# Patient Record
Sex: Male | Born: 1970 | Race: White | Hispanic: No | Marital: Married | Smoking: Former smoker
Health system: Southern US, Community
[De-identification: ages and names within clinical notes are randomized; demographics above are authoritative.]

## PROBLEM LIST (undated history)

## (undated) DIAGNOSIS — I10 Essential (primary) hypertension: Secondary | ICD-10-CM

## (undated) HISTORY — DX: Essential (primary) hypertension: I10

---

## 2015-05-13 ENCOUNTER — Encounter: Payer: Self-pay | Admitting: Allergy and Immunology

## 2015-05-13 ENCOUNTER — Ambulatory Visit (INDEPENDENT_AMBULATORY_CARE_PROVIDER_SITE_OTHER): Payer: BC Managed Care – PPO | Admitting: Allergy and Immunology

## 2015-05-13 VITALS — BP 120/62 | HR 76 | Temp 98.6°F | Resp 22 | Ht 65.67 in | Wt 252.4 lb

## 2015-05-13 DIAGNOSIS — H1010 Acute atopic conjunctivitis, unspecified eye: Secondary | ICD-10-CM

## 2015-05-13 DIAGNOSIS — J309 Allergic rhinitis, unspecified: Secondary | ICD-10-CM

## 2015-05-13 DIAGNOSIS — R05 Cough: Secondary | ICD-10-CM

## 2015-05-13 DIAGNOSIS — J329 Chronic sinusitis, unspecified: Secondary | ICD-10-CM | POA: Diagnosis not present

## 2015-05-13 DIAGNOSIS — K219 Gastro-esophageal reflux disease without esophagitis: Secondary | ICD-10-CM | POA: Diagnosis not present

## 2015-05-13 DIAGNOSIS — T464X5A Adverse effect of angiotensin-converting-enzyme inhibitors, initial encounter: Secondary | ICD-10-CM

## 2015-05-13 DIAGNOSIS — J4531 Mild persistent asthma with (acute) exacerbation: Secondary | ICD-10-CM

## 2015-05-13 MED ORDER — METHYLPREDNISOLONE ACETATE 80 MG/ML IJ SUSP
80.0000 mg | Freq: Once | INTRAMUSCULAR | Status: AC
  Administered 2015-05-13: 80 mg via INTRAMUSCULAR

## 2015-05-13 MED ORDER — AMOXICILLIN-POT CLAVULANATE 875-125 MG PO TABS: 1.0000 | ORAL_TABLET | Freq: Two times a day (BID) | ORAL | Status: DC

## 2015-05-13 MED ORDER — MONTELUKAST SODIUM 10 MG PO TABS: 10.0000 mg | ORAL_TABLET | Freq: Every day | ORAL | Status: DC

## 2015-05-13 MED ORDER — LOSARTAN POTASSIUM 50 MG PO TABS: 50.0000 mg | ORAL_TABLET | Freq: Every day | ORAL | Status: AC

## 2015-05-13 NOTE — Patient Instructions (Addendum)
  1. Allergen avoidance measures  2. Treat inflammation:   A. OTC Rhinocort one spray each nostril one time per day. Aim away from septum  B. montelukast 10 mg one tablet one time per day  C. Depo-Medrol 80 IM delivered in clinic today  3. Change lisinopril to losartan 50 mg one tablet one time per day. Check BP  4. Treat reflux:   A. minimize caffeine and chocolate consumption  B. OTC ranitidine 300 mg daily (150 mg tablets)  5. Treat infection:   A. Augmentin 875 one tablet twice a day for the next 20 days  6. If needed:   A. nasal saline multiple times a day  B. ProAir HFA or Proventil HFA 2 puffs every 4-6 hours  7. Return to clinic in 3 weeks or earlier if problem  8. Address the issue with lower extremity and upper extremity nerve problem

## 2015-05-13 NOTE — Addendum Note (Signed)
Addended by: Redge Gainer on: 05/13/2015 03:58 PM   Modules accepted: Orders

## 2015-05-13 NOTE — Progress Notes (Signed)
[facility]    NEW PATIENT NOTE  Referring Provider: No ref. provider found Primary Provider: Karie Schwalbe, PA Date of office visit: 05/13/2015    Subjective:   Chief Complaint:  Calvin Hopkins is a 45 y.o. male with a chief complaint of Pruritis and Nasal Congestion  who presents to the clinic with the following problems:  HPI Comments: Calvin Hopkins presents this clinic on 05/13/2015 in evaluation of respiratory tract problems and itching.  Calvin Hopkins is been itching for the past year. He massively itching for a little bit longer than a year. His itching is plateaued and has not progressed. There is no associated systemic or constitutional symptoms and he notes no obvious provoking factor giving rise to this issue. He did start lisinopril about a year ago but he thinks he started lisinopril after the itching.  Calvin Hopkins has developed problems over the course the past month or 2 with persistent nasal congestion and head fullness and decreased ability to smell along with some slight sneezing. He blows out clear nasal discharge. He's been treated with 2 antibiotics which have not really helped him very much. He was using an over-the-counter nasal decongestant spray which she stopped about a week ago. He does have pounding pressure in his head but no frank headache area he does have raw throat and itchy throat and throat clearing and constant cough. Sometimes he feels a little bit short of breath and his lungs. He went to the urgent care center was given a Proventil inhaler which has not really helped him. It should be noted that some of his cough may have started after he initiated treatment with lisinopril. He does not have any ugly sputum production or chest pain. He does not have a tremendous amount of reflux symptoms but he does belch all the time.   Past Medical History  Diagnosis Date  . Hypertension     History reviewed. No pertinent past  surgical history.   Current outpatient prescriptions:  .  ANDROGEL PUMP 20.25 MG/ACT (1.62%) GEL, , Disp: , Rfl:  .  azithromycin (ZITHROMAX) 500 MG tablet, , Disp: , Rfl:  .  busPIRone (BUSPAR) 15 MG tablet, , Disp: , Rfl:  .  CHERATUSSIN AC 100-10 MG/5ML syrup, , Disp: , Rfl:  .  lisinopril (PRINIVIL,ZESTRIL) 20 MG tablet, Take 20 mg by mouth., Disp: , Rfl:  .  Omega-3 1000 MG CAPS, Take by mouth., Disp: , Rfl:   No orders of the defined types were placed in this encounter.    No Known Allergies  Review of systems negative except as noted in HPI / PMHx or noted below:  Review of Systems  Constitutional: Negative.   HENT: Negative.   Eyes: Negative.   Respiratory: Negative.        Sleep apnea treated with CPAP  Cardiovascular: Negative.   Gastrointestinal: Negative.   Genitourinary: Negative.   Musculoskeletal: Negative.   Skin: Negative.   Neurological: Negative.        Calvin Hopkins has had left lower extremity pain including his left buttocks, left knee, and left heel. He is also developed left arm tingling and numbness. This is been a slow progressive problem for a while maybe over a year. He is seen a [facility] of the past month.  Endo/Heme/Allergies: Negative.   Psychiatric/Behavioral: Negative.        Calvin Hopkins's felt like he's had some anxiety and some decreased ability to sleep over the course of the  past year.    Family History  Problem Relation Age of Onset  . Allergic rhinitis Brother   . Asthma Brother     Social History   Social History  . Marital Status: Married    Spouse Name: N/A  . Number of Children: N/A  . Years of Education: N/A   Occupational History  . Not on file.   Social History Main Topics  . Smoking status: Former Smoker    Quit date: 05/12/2005  . Smokeless tobacco: Never Used  . Alcohol Use: Not on file  . Drug Use: Not on file  . Sexual Activity: Not on file   Other Topics Concern  . Not on file   Social History Narrative  . No  narrative on file    Environmental and Social history  Lives in a house with a dry environment, no animals located inside the household, carpeting in the bedroom, sleeping of step mattress without any plastic for the bed or pillow, and no smokers located inside the household. He works as a Production designer, theatre/television/film man at [employer] and is exposed to all forms of maintenance fumes and dust.   Objective:   Filed Vitals:   05/13/15 0931  BP: 120/62  Pulse: 76  Temp: 98.6 F (37 C)  Resp: 22   Height: 5' 5.67" (166.8 cm) Weight: 252 lb 6.8 oz (114.5 kg)  Physical Exam  Constitutional: He is well-developed, well-nourished, and in no distress. No distress.  Nasal voice  HENT:  Head: Normocephalic. Head is without right periorbital erythema and without left periorbital erythema.  Right Ear: Tympanic membrane, external ear and ear canal normal.  Left Ear: Tympanic membrane, external ear and ear canal normal.  Nose: Mucosal edema (Left septal excoriation) present. No rhinorrhea.  Mouth/Throat: Oropharynx is clear and moist and mucous membranes are normal. No oropharyngeal exudate.  Eyes: Conjunctivae and lids are normal. Pupils are equal, round, and reactive to light.  Neck: Trachea normal. No tracheal deviation present. No thyromegaly present.  Cardiovascular: Normal rate, regular rhythm, S1 normal, S2 normal and normal heart sounds.   No murmur heard. Pulmonary/Chest: Effort normal. No stridor. No tachypnea. No respiratory distress. He has no wheezes. He has no rales. He exhibits no tenderness.  Abdominal: Soft. He exhibits no distension and no mass. There is no hepatosplenomegaly. There is no tenderness. There is no rebound and no guarding.  Musculoskeletal: He exhibits no edema or tenderness.  Lymphadenopathy:       Head (right side): No tonsillar adenopathy present.       Head (left side): No tonsillar adenopathy present.    He has no cervical adenopathy.    He has no axillary adenopathy.   Neurological: He is alert. Gait normal.  Skin: No rash noted. He is not diaphoretic. No erythema. No pallor. Nails show no clubbing.  Psychiatric: Mood and affect normal.     Diagnostics:  Allergy skin tests were performed. He demonstrated hypersensitivity to grasses, weeds, and house dust mite  Spirometry was performed and demonstrated an FEV1 of 2.63 @ 74 % of predicted.    The patient had an Asthma Control Test with the following results:  .     Assessment and Plan:    1. Allergic rhinoconjunctivitis   2. Chronic sinusitis, unspecified location   3. Asthma, not well controlled, mild persistent, with acute exacerbation   4. Gastroesophageal reflux disease, esophagitis presence not specified   5. ACE-inhibitor cough     1. Allergen avoidance measures  2. Treat inflammation:   A. OTC Rhinocort one spray each nostril one time per day. Aim away from septum  B. montelukast 10 mg one tablet one time per day  C. Depo-Medrol 80 IM delivered in clinic today  3. Change lisinopril to losartan 50 mg one tablet one time per day. Check BP  4. Treat reflux:   A. minimize caffeine and chocolate consumption  B. OTC ranitidine 300 mg daily (150 mg tablets)  5. Treat infection:   A. Augmentin 875 one tablet twice a day for the next 20 days  6. If needed:   A. nasal saline multiple times a day  B. ProAir HFA or Proventil HFA 2 puffs every 4-6 hours  7. Return to clinic in 3 weeks or earlier if problem  8. Address the issue with lower extremity and upper extremity nerve problem  Calvin Hopkins has significant inflammation of his respiratory tract and I will assume that the majority of the symptoms are secondary to this inflammatory process complicated by a prolonged episode of sinusitis. Some of his respiratory tract symptoms may be tied up with reflux and may be tied up with the use of his lisinopril and we'll have him eliminate lisinopril at the same time that he does address the issue  of reflux by using ranitidine. I don't think his asthma is particularly active and for the most part should not be a particularly hard issue to get under control. I'll be regroup with him in approximately 3 weeks or earlier if there is a problem.   Laurette Schimke, MD [facility]

## 2015-05-24 ENCOUNTER — Ambulatory Visit: Payer: Self-pay | Admitting: Pediatrics

## 2015-06-06 ENCOUNTER — Encounter: Payer: Self-pay | Admitting: Allergy and Immunology

## 2015-06-06 ENCOUNTER — Ambulatory Visit (INDEPENDENT_AMBULATORY_CARE_PROVIDER_SITE_OTHER): Payer: BC Managed Care – PPO | Admitting: Allergy and Immunology

## 2015-06-06 VITALS — BP 122/78 | HR 88 | Resp 16

## 2015-06-06 DIAGNOSIS — R05 Cough: Secondary | ICD-10-CM

## 2015-06-06 DIAGNOSIS — J329 Chronic sinusitis, unspecified: Secondary | ICD-10-CM

## 2015-06-06 DIAGNOSIS — J309 Allergic rhinitis, unspecified: Secondary | ICD-10-CM

## 2015-06-06 DIAGNOSIS — H1010 Acute atopic conjunctivitis, unspecified eye: Secondary | ICD-10-CM | POA: Diagnosis not present

## 2015-06-06 DIAGNOSIS — J4530 Mild persistent asthma, uncomplicated: Secondary | ICD-10-CM | POA: Diagnosis not present

## 2015-06-06 DIAGNOSIS — K219 Gastro-esophageal reflux disease without esophagitis: Secondary | ICD-10-CM

## 2015-06-06 DIAGNOSIS — T464X5A Adverse effect of angiotensin-converting-enzyme inhibitors, initial encounter: Secondary | ICD-10-CM

## 2015-06-06 NOTE — Patient Instructions (Addendum)
  1. Allergen avoidance measures  2. Treat inflammation:   A. decrease OTC Rhinocort one spray each nostril 3 times a week  B. montelukast 10 mg one tablet one time per day  3. Continue losartan 50 mg one tablet one time per day.   4. Treat reflux:   A. minimize caffeine and chocolate consumption  B. OTC ranitidine 300 mg daily (150 mg tablets)  5. If needed:   A. nasal saline multiple times a day  B. ProAir HFA or Proventil HFA 2 puffs every 4-6 hours  6. Evaluation with ear nose and throat physician for sinus problems,     Appt with Meta Hatchet, PA Feb. 10@ 9:10  7. Return to clinic in 12 weeks or earlier if problem  8. Address the issue with lower extremity and upper extremity nerve problem

## 2015-06-06 NOTE — Progress Notes (Signed)
Follow-up Note  Referring Provider: Karie Schwalbe, PA Primary Provider: Karie Schwalbe, PA Date of Office Visit: 06/06/2015  Subjective:   Calvin Hopkins (DOB: 1971-01-17) is a 45 y.o. male who [facility]2015 in re-evaluation of the following:  HPI Comments: Calvin Hopkins returns to this clinic in reevaluation of his respiratory tract symptoms and itching. I last saw him in this clinic approximately 3 weeks ago at which time we had him perform house dust avoidance measures, utilize 20 days of Augmentin, use anti-inflammatory medications for his upper airway including Rhinocort, utilize montelukast, and treat reflux with over-the-counter Rhinocort and eliminating his caffeine consumption. In addition, wheeze discontinued his lisinopril.  He is better in some ways. His itching did completely resolve. His cough is dramatically better area is throat clearing is dramatically improved. However, he still has nasal congestion and head fullness and some decreased ability to smell somewhat. He does not have any issues with reflux at this point in time. He has noticed that he's developed some problems with bloody nose recently.  On a completely different issue, he still continues to have left arm pain and numbness and still continues to have buttocks and leg pain on the left. He has not sought out counseling with a physician to obtain an MRI to rule out significant radiculopathy. He has seen a chiropractor who informed him that it is time to go get an imaging study of his spine.   Current Outpatient Prescriptions on File Prior to Visit  Medication Sig Dispense Refill  . albuterol (PROVENTIL HFA) 108 (90 Base) MCG/ACT inhaler Inhale 2 puffs into the lungs every 4 (four) hours as needed for wheezing or shortness of breath.    . ANDROGEL PUMP 20.25 MG/ACT (1.62%) GEL     . losartan (COZAAR) 50 MG tablet Take 1 tablet (50 mg total) by mouth daily. 30 tablet 5  . montelukast  (SINGULAIR) 10 MG tablet Take 1 tablet (10 mg total) by mouth at bedtime. 30 tablet 5  . Omega-3 1000 MG CAPS Take by mouth.     No current facility-administered medications on file prior to visit.    Past Medical History  Diagnosis Date  . Hypertension     No past surgical history on file.  No Known Allergies  Review of systems negative except as noted in HPI / PMHx or noted below:  Review of Systems  Constitutional: Negative.   HENT: Negative.   Eyes: Negative.   Respiratory: Negative.   Cardiovascular: Negative.   Gastrointestinal: Negative.   Genitourinary: Negative.   Musculoskeletal: Negative.   Skin: Negative.   Neurological: Negative.        Left arm and leg pain and numbness  Endo/Heme/Allergies: Negative.   Psychiatric/Behavioral: Negative.      Objective:   Filed Vitals:   06/06/15 1700  BP: 122/78  Pulse: 88  Resp: 16          Physical Exam  Constitutional: He is well-developed, well-nourished, and in no distress.  HENT:  Head: Normocephalic.  Right Ear: Tympanic membrane, external ear and ear canal normal.  Left Ear: Tympanic membrane, external ear and ear canal normal.  Nose: Mucosal edema present. No rhinorrhea.  Mouth/Throat: Uvula is midline, oropharynx is clear and moist and mucous membranes are normal. No oropharyngeal exudate.  Eyes: Conjunctivae are normal.  Neck: Trachea normal. No tracheal tenderness present. No tracheal deviation present. No thyromegaly present.  Cardiovascular: Normal rate, regular rhythm, S1 normal,  S2 normal and normal heart sounds.   No murmur heard. Pulmonary/Chest: Breath sounds normal. No stridor. No respiratory distress. He has no wheezes. He has no rales.  Musculoskeletal: He exhibits no edema.  Lymphadenopathy:       Head (right side): No tonsillar adenopathy present.       Head (left side): No tonsillar adenopathy present.    He has no cervical adenopathy.    He has no axillary adenopathy.    Neurological: He is alert. Gait normal.  Skin: No rash noted. He is not diaphoretic. No erythema. Nails show no clubbing.  Psychiatric: Mood and affect normal.    Diagnostics:    Spirometry was performed and demonstrated an FEV1 of 2.99 at 84 % of predicted.  The patient had an Asthma Control Test with the following results:  .    Assessment and Plan:   1. Asthma, well controlled, mild persistent   2. Allergic rhinoconjunctivitis   3. Chronic sinusitis, unspecified location   4. Gastroesophageal reflux disease, esophagitis presence not specified   5. ACE-inhibitor cough     1. Allergen avoidance measures  2. Treat inflammation:   A. decrease OTC Rhinocort one spray each nostril 3 times a week  B. montelukast 10 mg one tablet one time per day  3. Continue losartan 50 mg one tablet one time per day.   4. Treat reflux:   A. minimize caffeine and chocolate consumption  B. OTC ranitidine 300 mg daily (150 mg tablets)  5. If needed:   A. nasal saline multiple times a day  B. ProAir HFA or Proventil HFA 2 puffs every 4-6 hours  6. Evaluation with ear nose and throat physician for sinus problems  7. Return to clinic in 12 weeks or earlier if problem  8. Address the issue with lower extremity and upper extremity nerve problem  [name] is better in some ways. Certainly his cough and his itchiness seemed to have resolved with elimination of lisinopril and therapy directed against reflux and some anti-inflammatory therapy for his respiratory tract. However, I don't think his sinus issue is completely cleared up and I would like for him to be evaluated by an ear nose and throat physician to examine for persistent sinus problems. He did have a head CT scan performed back in January of this year which identified maxillary sinus disease and rather than having him exposed to more radiation I think could be best to have him evaluated by an ear nose and throat physician. We'll keep him on  therapy directed against reflux and inflammation and see him back in this clinic in 12 weeks or earlier if there is a problem. He'll continue to use his losartan for his high blood pressure in place of lisinopril and I have counseled him that he needs to follow through with getting evaluation for his neurological issue which is most likely a spinal radiculopathy. Certainly he could be having other types of nerve damage associated with an autoimmune immune phenomena but this is most likely a anatomical problem.  Laurette Schimke, MD [facility]

## 2015-06-09 ENCOUNTER — Ambulatory Visit: Payer: Self-pay | Admitting: Allergy and Immunology

## 2015-08-29 ENCOUNTER — Ambulatory Visit: Payer: BC Managed Care – PPO | Admitting: Allergy and Immunology

## 2015-10-06 ENCOUNTER — Other Ambulatory Visit: Payer: Self-pay | Admitting: Family Medicine

## 2015-10-06 DIAGNOSIS — M542 Cervicalgia: Secondary | ICD-10-CM

## 2015-10-06 DIAGNOSIS — T1590XA Foreign body on external eye, part unspecified, unspecified eye, initial encounter: Secondary | ICD-10-CM

## 2015-10-14 ENCOUNTER — Ambulatory Visit
Admission: RE | Admit: 2015-10-14 | Discharge: 2015-10-14 | Disposition: A | Discharge status: Home / Self Care | Payer: BC Managed Care – PPO | Source: Ambulatory Visit | Attending: Family Medicine | Admitting: Family Medicine

## 2015-10-14 DIAGNOSIS — M542 Cervicalgia: Secondary | ICD-10-CM

## 2015-10-14 DIAGNOSIS — T1590XA Foreign body on external eye, part unspecified, unspecified eye, initial encounter: Secondary | ICD-10-CM

## 2015-11-07 ENCOUNTER — Other Ambulatory Visit: Payer: Self-pay | Admitting: Allergy and Immunology

## 2016-01-02 ENCOUNTER — Other Ambulatory Visit: Payer: Self-pay | Admitting: Allergy and Immunology

## 2016-01-05 ENCOUNTER — Other Ambulatory Visit: Payer: Self-pay | Admitting: *Deleted

## 2016-01-05 MED ORDER — MONTELUKAST SODIUM 10 MG PO TABS: 10.0000 mg | ORAL_TABLET | Freq: Every day | ORAL | 0 refills | Status: AC

## 2017-07-27 IMAGING — CR DG ORBITS FOR FOREIGN BODY
2 series · 2 of 2 positions shown · non-contrast
Comparison: None.

CLINICAL DATA: Metal working/exposure; clearance prior to MRI

EXAM:
ORBITS FOR FOREIGN BODY - 2 VIEW

[w orbit pa (1 of 2)]
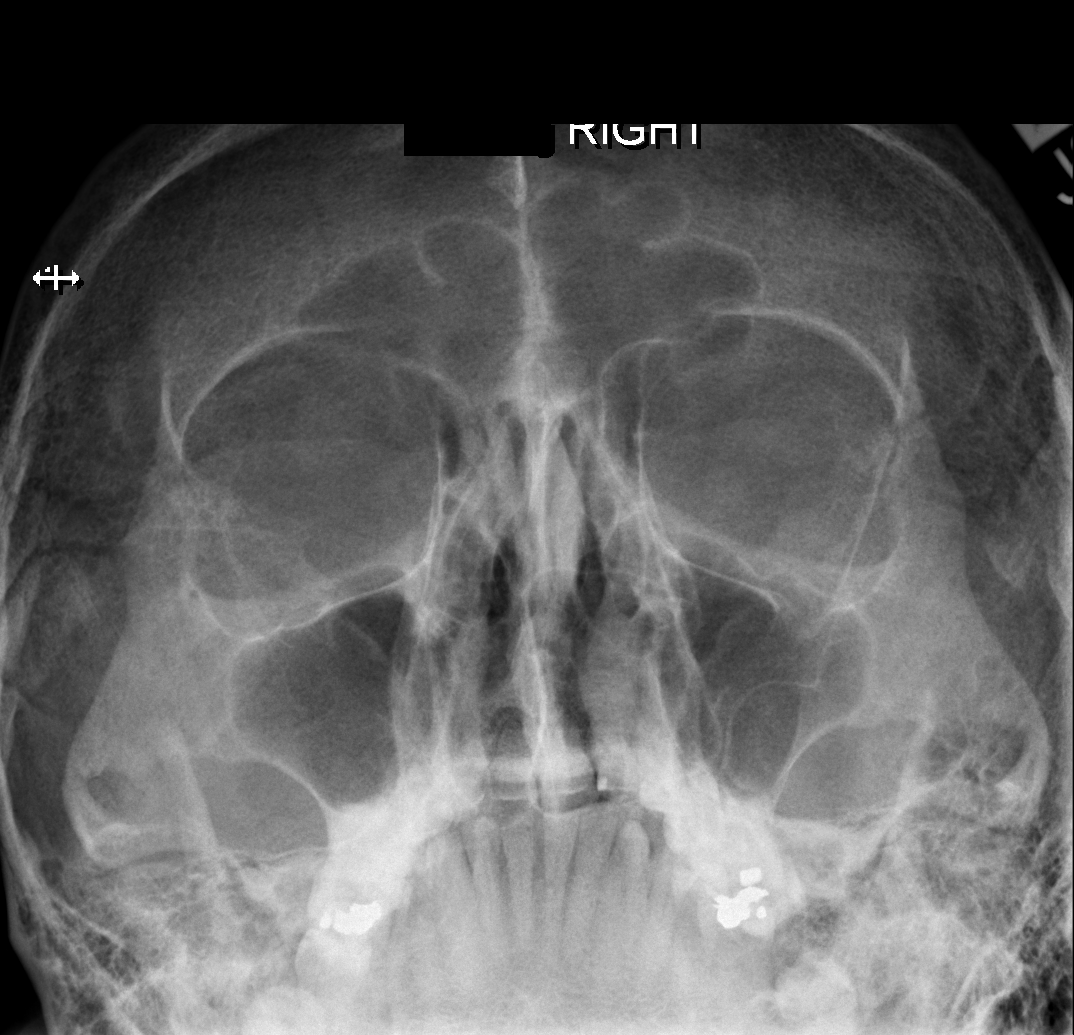

[w orbit pa (2 of 2)]
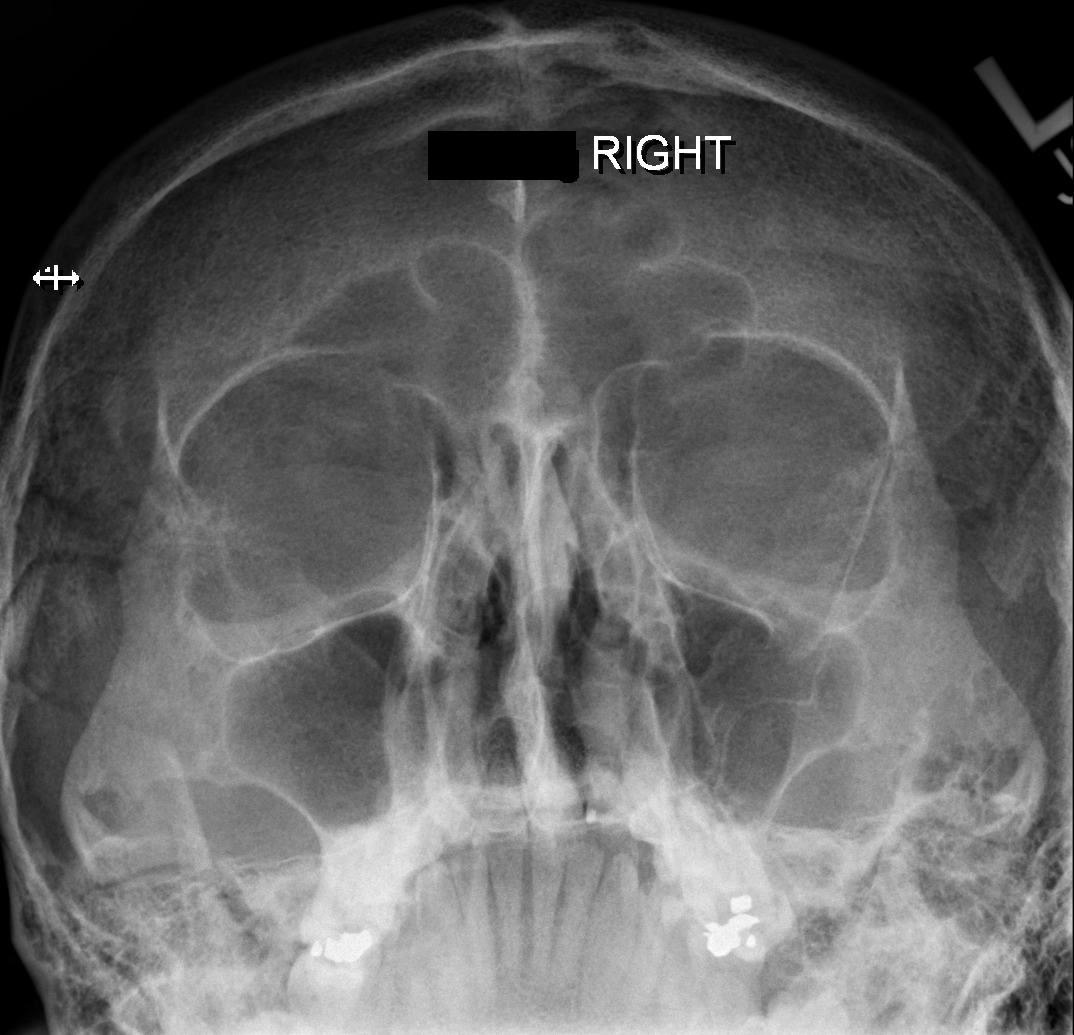

[2 of 2 positions shown; findings below may reference images not displayed]

FINDINGS: There is no evidence of metallic foreign body within the orbits. No
significant bone abnormality identified.
IMPRESSION: No evidence of metallic foreign body within the orbits.

## 2017-07-27 IMAGING — MR MR CERVICAL SPINE W/O CM
5 series · 29 of 48 positions shown · non-contrast
Comparison: None.

CLINICAL DATA: Neck pain and right arm and hand pain and numbness.
Fell 6 months ago.

EXAM:
MRI CERVICAL SPINE WITHOUT CONTRAST
TECHNIQUE: Multiplanar, multisequence MR imaging of the cervical spine was
performed. No intravenous contrast was administered.

[Series 3: T2 · sagittal · 3.0mm · 0.66mm/px · 6 of 12 slices shown (1 of 2)]
[im 1/12]
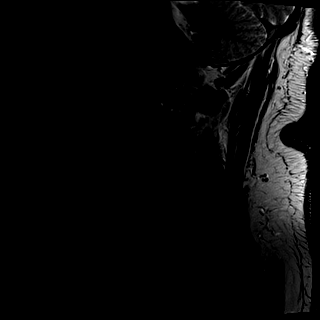
[im 3/12]
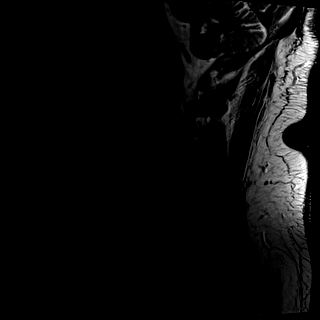
[im 5/12]
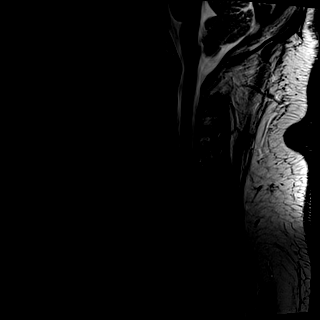
[im 7/12]
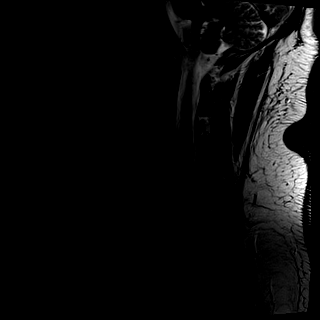
[im 9/12]
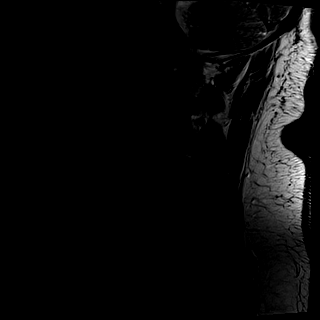
[im 12/12]
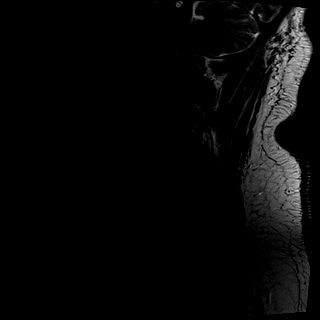

[Series 4: T1 · sagittal · 3.0mm · 0.41mm/px · 6 of 12 slices shown]
[im 1/12]
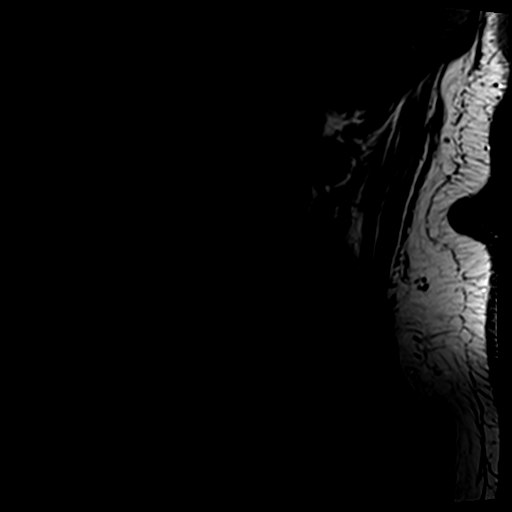
[im 3/12]
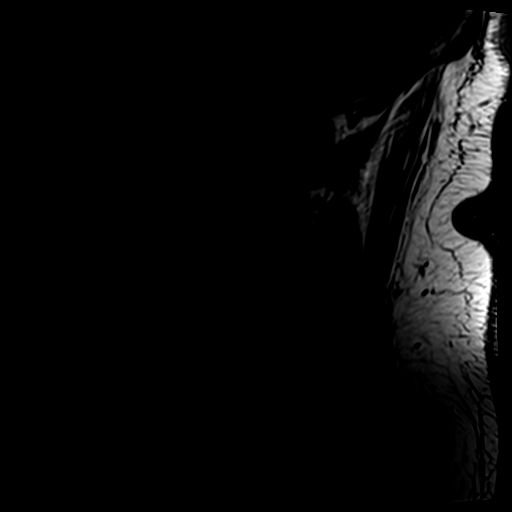
[im 5/12]
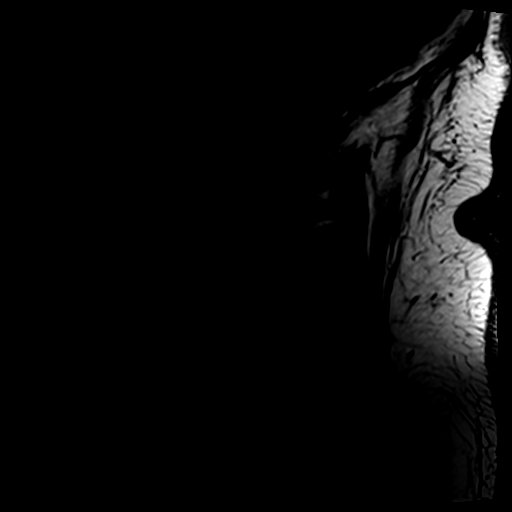
[im 7/12]
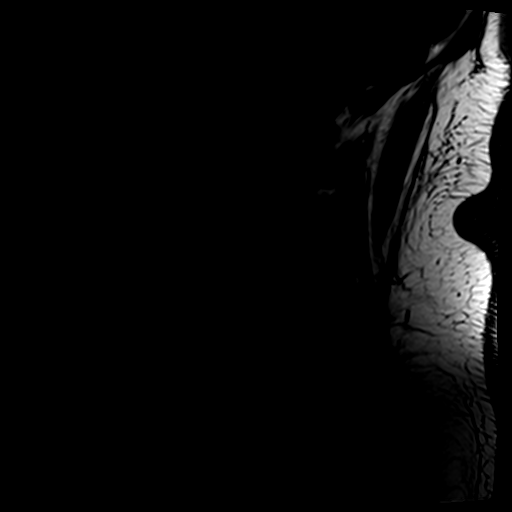
[im 9/12]
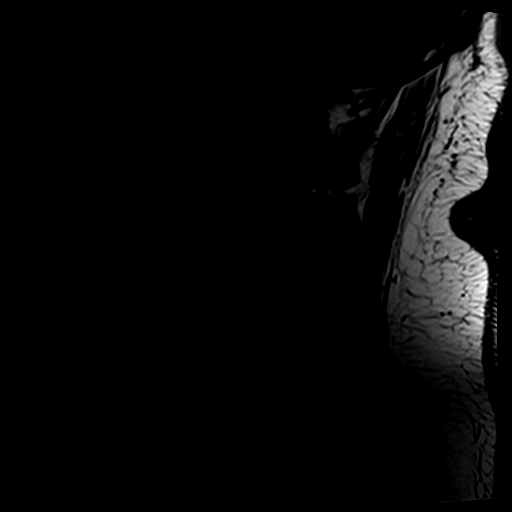
[im 12/12]
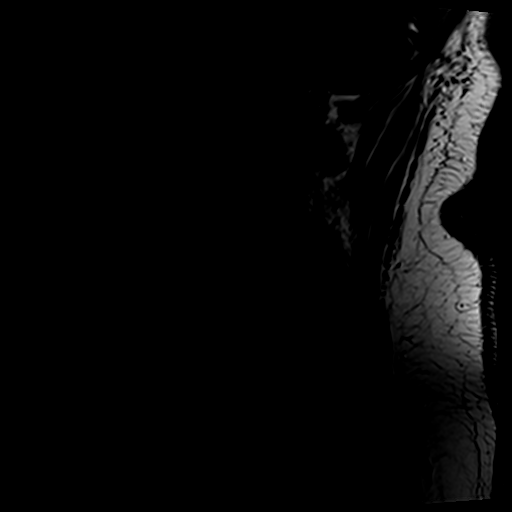

[Series 5: tir sag · sagittal · 3.0mm · 0.41mm/px · 6 of 12 slices shown]
[im 1/12]
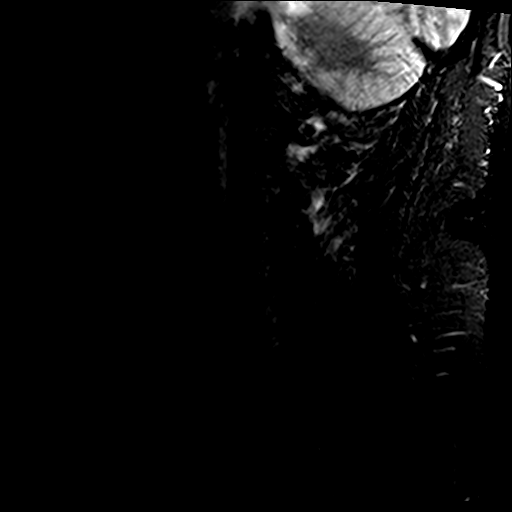
[im 3/12]
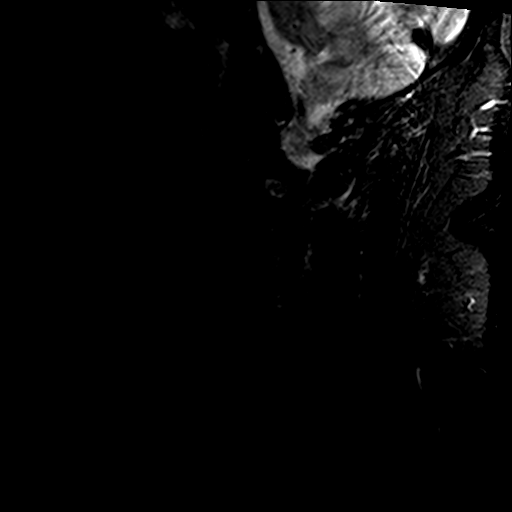
[im 5/12]
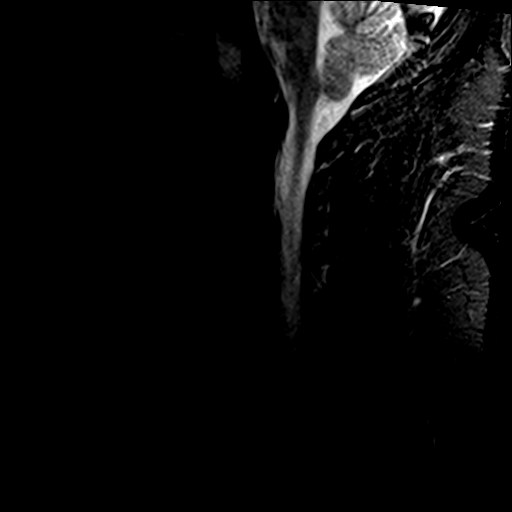
[im 7/12]
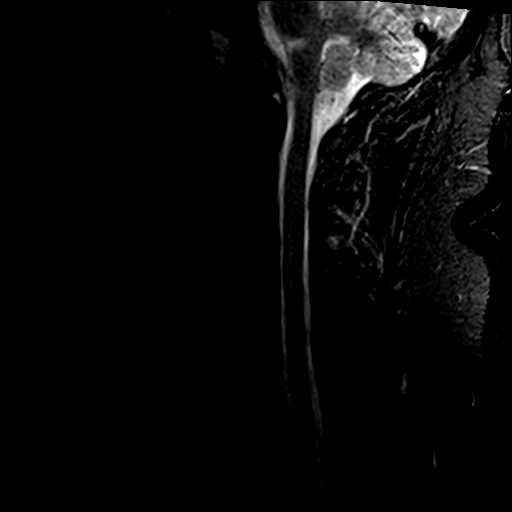
[im 9/12]
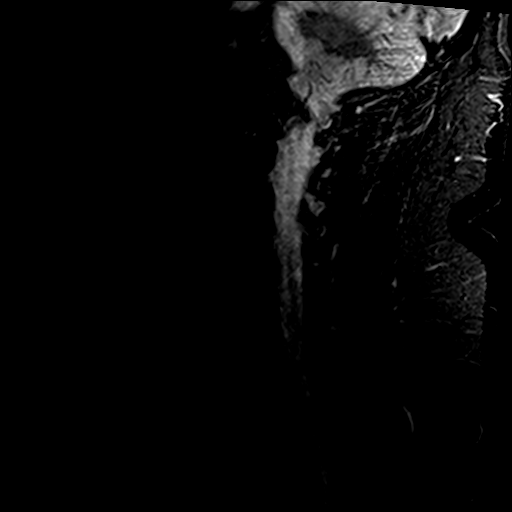
[im 12/12]
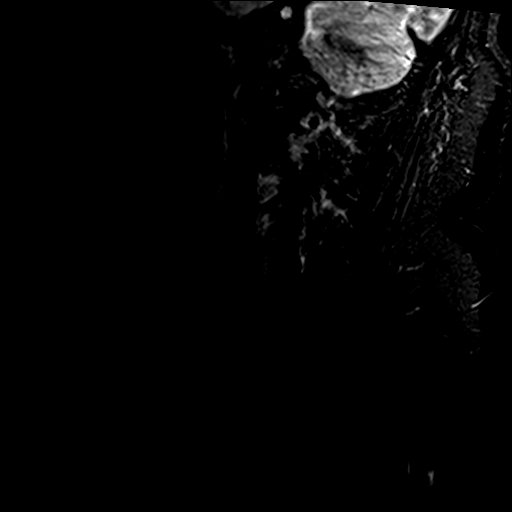

[Series 6: GRE · axial · 3.0mm · 0.35mm/px · z∈[-64,-49]mm · 2 of 32 slices shown]
[im 1/32]
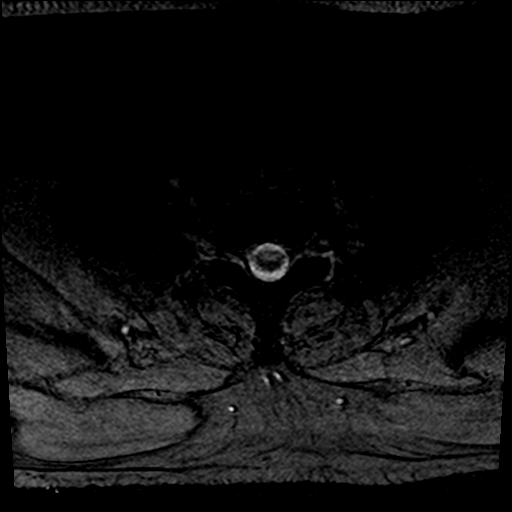
[im 5/32]
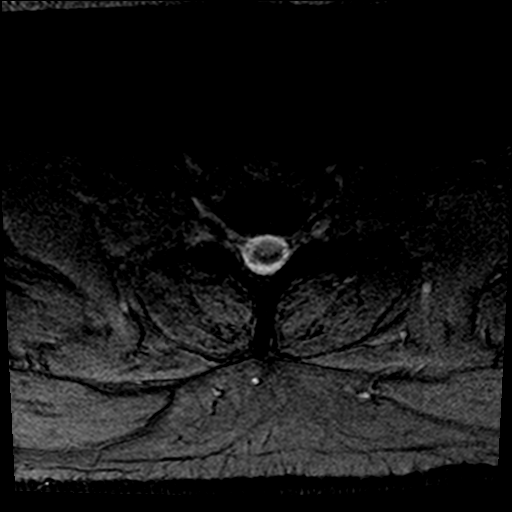

[Series 7: T2 · axial · 3.0mm · 0.70mm/px · z∈[-64,+53]mm · 9 of 32 slices shown (2 of 2)]
[im 1/32]
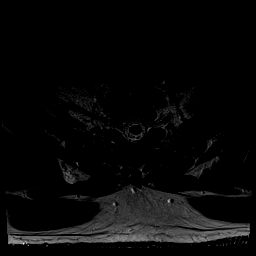
[im 5/32]
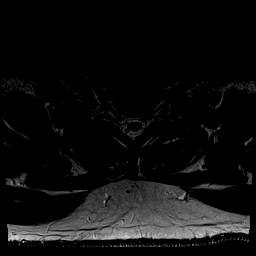
[im 9/32]
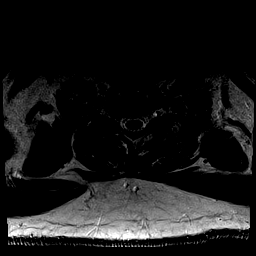
[im 14/32]
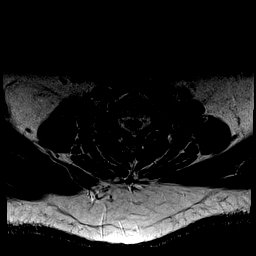
[im 16/32]
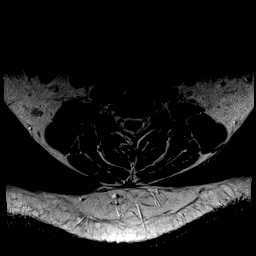
[im 18/32]
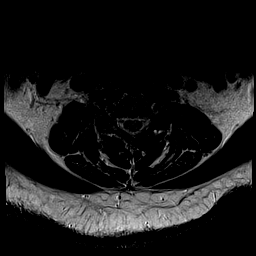
[im 23/32]
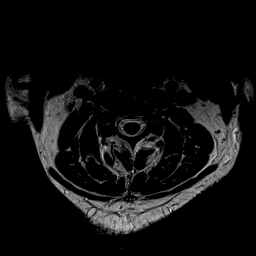
[im 27/32]
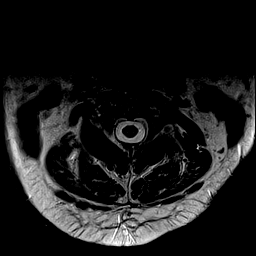
[im 32/32]
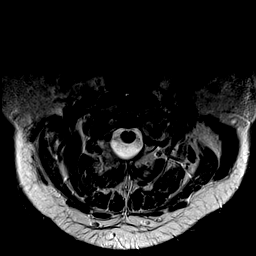

[29 of 48 positions shown; findings below may reference images not displayed]

FINDINGS: Examination is limited due to poor signal and motion artifact.
Patient is large and claustrophobic.

Alignment: Normal.

Vertebrae: Normal.  T2 hemangioma is noted.

Cord: Normal

Posterior Fossa, vertebral arteries, paraspinal tissues: Grossly
normal.

Disc levels:

C2-3:  No significant findings.

C3-4: Mild asymmetric right-sided facet disease and a shallow disc
osteophyte complex contributing to right foraminal stenosis. No
spinal stenosis.

C4-5:  No significant findings.

C5-6:  No significant findings.

C6-7:  No significant findings.

C7-T1:  No significant findings.
IMPRESSION: Mild right foraminal stenosis at C3-4 due to a shallow disc
osteophyte complex and asymmetric right-sided facet disease.

The other intervertebral disc spaces are unremarkable.

## 2018-01-29 ENCOUNTER — Other Ambulatory Visit: Payer: Self-pay | Admitting: Orthopaedic Surgery

## 2018-01-29 DIAGNOSIS — M25551 Pain in right hip: Secondary | ICD-10-CM

## 2018-02-08 ENCOUNTER — Ambulatory Visit
Admission: RE | Admit: 2018-02-08 | Discharge: 2018-02-08 | Disposition: A | Discharge status: Home / Self Care | Payer: BC Managed Care – PPO | Source: Ambulatory Visit | Attending: Orthopaedic Surgery | Admitting: Orthopaedic Surgery

## 2018-02-08 DIAGNOSIS — M25551 Pain in right hip: Secondary | ICD-10-CM

## 2018-02-08 MED ORDER — GADOBENATE DIMEGLUMINE 529 MG/ML IV SOLN
20.0000 mL | Freq: Once | INTRAVENOUS | Status: AC | PRN
  Administered 2018-02-08: 20 mL via INTRAVENOUS

## 2018-06-13 DIAGNOSIS — Z01818 Encounter for other preprocedural examination: Secondary | ICD-10-CM
